# Patient Record
Sex: Female | Born: 1950 | Race: Black or African American | Hispanic: No | Smoking: Never smoker
Health system: Southern US, Community
[De-identification: ages and names within clinical notes are randomized; demographics above are authoritative.]

## PROBLEM LIST (undated history)

## (undated) DIAGNOSIS — I10 Essential (primary) hypertension: Secondary | ICD-10-CM

## (undated) DIAGNOSIS — M549 Dorsalgia, unspecified: Secondary | ICD-10-CM

## (undated) DIAGNOSIS — G8929 Other chronic pain: Secondary | ICD-10-CM

## (undated) DIAGNOSIS — I4510 Unspecified right bundle-branch block: Secondary | ICD-10-CM

## (undated) DIAGNOSIS — F329 Major depressive disorder, single episode, unspecified: Secondary | ICD-10-CM

## (undated) DIAGNOSIS — R5383 Other fatigue: Secondary | ICD-10-CM

## (undated) DIAGNOSIS — K219 Gastro-esophageal reflux disease without esophagitis: Secondary | ICD-10-CM

## (undated) DIAGNOSIS — F32A Depression, unspecified: Secondary | ICD-10-CM

## (undated) DIAGNOSIS — K635 Polyp of colon: Secondary | ICD-10-CM

## (undated) HISTORY — PX: ABDOMINAL HYSTERECTOMY: SHX81

## (undated) HISTORY — PX: BACK SURGERY: SHX140

---

## 2015-01-03 ENCOUNTER — Emergency Department (HOSPITAL_BASED_OUTPATIENT_CLINIC_OR_DEPARTMENT_OTHER)
Admission: EM | Admit: 2015-01-03 | Discharge: 2015-01-03 | Disposition: A | Discharge status: Home / Self Care | Payer: Medicare Other | Attending: Emergency Medicine | Admitting: Emergency Medicine

## 2015-01-03 ENCOUNTER — Encounter (HOSPITAL_BASED_OUTPATIENT_CLINIC_OR_DEPARTMENT_OTHER): Payer: Self-pay | Admitting: *Deleted

## 2015-01-03 ENCOUNTER — Emergency Department (HOSPITAL_BASED_OUTPATIENT_CLINIC_OR_DEPARTMENT_OTHER): Payer: Medicare Other

## 2015-01-03 DIAGNOSIS — Z7951 Long term (current) use of inhaled steroids: Secondary | ICD-10-CM | POA: Diagnosis not present

## 2015-01-03 DIAGNOSIS — I10 Essential (primary) hypertension: Secondary | ICD-10-CM | POA: Diagnosis not present

## 2015-01-03 DIAGNOSIS — R6883 Chills (without fever): Secondary | ICD-10-CM | POA: Diagnosis not present

## 2015-01-03 DIAGNOSIS — Z86010 Personal history of colonic polyps: Secondary | ICD-10-CM | POA: Insufficient documentation

## 2015-01-03 DIAGNOSIS — G8929 Other chronic pain: Secondary | ICD-10-CM | POA: Diagnosis not present

## 2015-01-03 DIAGNOSIS — F329 Major depressive disorder, single episode, unspecified: Secondary | ICD-10-CM | POA: Diagnosis not present

## 2015-01-03 DIAGNOSIS — R079 Chest pain, unspecified: Secondary | ICD-10-CM | POA: Diagnosis present

## 2015-01-03 DIAGNOSIS — Z79899 Other long term (current) drug therapy: Secondary | ICD-10-CM | POA: Diagnosis not present

## 2015-01-03 DIAGNOSIS — R911 Solitary pulmonary nodule: Secondary | ICD-10-CM | POA: Diagnosis not present

## 2015-01-03 DIAGNOSIS — K219 Gastro-esophageal reflux disease without esophagitis: Secondary | ICD-10-CM | POA: Insufficient documentation

## 2015-01-03 DIAGNOSIS — R1012 Left upper quadrant pain: Secondary | ICD-10-CM | POA: Insufficient documentation

## 2015-01-03 HISTORY — DX: Other chronic pain: G89.29

## 2015-01-03 HISTORY — DX: Gastro-esophageal reflux disease without esophagitis: K21.9

## 2015-01-03 HISTORY — DX: Unspecified right bundle-branch block: I45.10

## 2015-01-03 HISTORY — DX: Polyp of colon: K63.5

## 2015-01-03 HISTORY — DX: Other fatigue: R53.83

## 2015-01-03 HISTORY — DX: Major depressive disorder, single episode, unspecified: F32.9

## 2015-01-03 HISTORY — DX: Depression, unspecified: F32.A

## 2015-01-03 HISTORY — DX: Essential (primary) hypertension: I10

## 2015-01-03 HISTORY — DX: Dorsalgia, unspecified: M54.9

## 2015-01-03 LAB — CBC WITH DIFFERENTIAL/PLATELET
BASOS ABS: 0 10*3/uL (ref 0.0–0.1)
Basophils Relative: 0 % (ref 0–1)
Eosinophils Absolute: 0 10*3/uL (ref 0.0–0.7)
Eosinophils Relative: 1 % (ref 0–5)
HEMATOCRIT: 40.5 % (ref 36.0–46.0)
Hemoglobin: 13.3 g/dL (ref 12.0–15.0)
LYMPHS ABS: 1.2 10*3/uL (ref 0.7–4.0)
Lymphocytes Relative: 40 % (ref 12–46)
MCH: 30.2 pg (ref 26.0–34.0)
MCHC: 32.8 g/dL (ref 30.0–36.0)
MCV: 91.8 fL (ref 78.0–100.0)
Monocytes Absolute: 0.3 10*3/uL (ref 0.1–1.0)
Monocytes Relative: 9 % (ref 3–12)
Neutro Abs: 1.4 10*3/uL — ABNORMAL LOW (ref 1.7–7.7)
Neutrophils Relative %: 50 % (ref 43–77)
Platelets: 123 10*3/uL — ABNORMAL LOW (ref 150–400)
RBC: 4.41 MIL/uL (ref 3.87–5.11)
RDW: 12.3 % (ref 11.5–15.5)
WBC: 2.9 10*3/uL — ABNORMAL LOW (ref 4.0–10.5)

## 2015-01-03 LAB — BASIC METABOLIC PANEL
Anion gap: <3 — ABNORMAL LOW (ref 5–15)
BUN: 12 mg/dL (ref 6–23)
CO2: 28 mmol/L (ref 19–32)
CREATININE: 0.68 mg/dL (ref 0.50–1.10)
Calcium: 9.2 mg/dL (ref 8.4–10.5)
Chloride: 108 mmol/L (ref 96–112)
GFR calc Af Amer: >90 mL/min (ref >90)
GFR calc non Af Amer: 90 mL/min — ABNORMAL LOW (ref >90)
Glucose, Bld: 119 mg/dL — ABNORMAL HIGH (ref 70–99)
Potassium: 3.8 mmol/L (ref 3.5–5.1)
SODIUM: 138 mmol/L (ref 135–145)

## 2015-01-03 LAB — TROPONIN I: Troponin I: <0.03 ng/mL (ref <0.031)

## 2015-01-03 LAB — D-DIMER, QUANTITATIVE: D-Dimer, Quant: <0.27 ug/mL-FEU (ref 0.00–0.48)

## 2015-01-03 MED ORDER — MORPHINE SULFATE 4 MG/ML IJ SOLN
4.0000 mg | Freq: Once | INTRAMUSCULAR | Status: AC
  Administered 2015-01-03: 4 mg via INTRAVENOUS
  Filled 2015-01-03: qty 1

## 2015-01-03 NOTE — ED Provider Notes (Signed)
CSN: [id number]     Arrival date & time 01/03/15  1533 History  This chart was scribed for Tracy SkeensJoshua M Tyrees Chopin, MD by [place] Peace, ED Scribe. The patient was seen in [facility]. The patient's care was started at 4:19 PM.     Chief Complaint  Patient presents with  . Chest Pain      Patient is a 64 y.o. female presenting with chest pain. The history is provided by the patient. No language interpreter was used.  Chest Pain Associated symptoms: no cough, no fever, no nausea, no shortness of breath and not vomiting     HPI Comments: Tracy Nixon is a 64 y.o. female who presents to the Emergency Department complaining of constant radiating left flank pain onset 3 weeks ago that extends around to her scapula. Pt reports pain is extremely severe. Pt also reports weight lost of about 30 lbs since August 2015. She denies any mechanisms of injury. No complaints of fever, vomiting, diarrhea, neck pain, SOB, cough. History of hypertension.  She denies history of cancer, blood clots, or family members who suffered heart attacks under the age of 66.   Past Medical History  Diagnosis Date  . Hypertension   . Depressed   . Chronic back pain   . Gastroesophageal reflux   . Right bundle branch block   . Colon polyp   . Fatigue    Past Surgical History  Procedure Laterality Date  . Back surgery    . Abdominal hysterectomy     No family history on file. History  Substance Use Topics  . Smoking status: Never Smoker   . Smokeless tobacco: Not on file  . Alcohol Use: No   OB History    No data available     Review of Systems  Constitutional: Positive for chills. Negative for fever.  Respiratory: Negative for cough and shortness of breath.   Cardiovascular: Positive for chest pain.  Gastrointestinal: Negative for nausea, vomiting and diarrhea.  Musculoskeletal: Negative for neck pain.       Left flank pain.       Allergies  Review of patient's allergies indicates no known allergies.  Home  Medications   Prior to Admission medications   Medication Sig Start Date End Date Taking? Authorizing Provider  azelastine (ASTELIN) 0.1 % nasal spray Place 1 spray into both nostrils 2 (two) times daily. Use in each nostril as directed   Yes Historical Provider, MD  diclofenac sodium (VOLTAREN) 1 % GEL Apply topically 4 (four) times daily.   Yes Historical Provider, MD  DULoxetine (CYMBALTA) 30 MG capsule Take 30 mg by mouth daily.   Yes Historical Provider, MD  enalapril (VASOTEC) 10 MG tablet Take 10 mg by mouth daily.   Yes Historical Provider, MD  esomeprazole (NEXIUM) 40 MG capsule Take 40 mg by mouth daily at 12 noon.   Yes Historical Provider, MD  ezetimibe-simvastatin (VYTORIN) 10-10 MG per tablet Take 1 tablet by mouth at bedtime.   Yes Historical Provider, MD  fluticasone (FLOVENT HFA) 220 MCG/ACT inhaler Inhale into the lungs 2 (two) times daily.   Yes Historical Provider, MD  furosemide (LASIX) 40 MG tablet Take 40 mg by mouth.   Yes Historical Provider, MD  HYDROmorphone HCl (EXALGO) 12 MG T24A SR tablet Take 12 mg by mouth daily.   Yes Historical Provider, MD  ondansetron (ZOFRAN) 4 MG tablet Take 4 mg by mouth every 8 (eight) hours as needed for nausea or vomiting.   Yes Historical  Provider, MD  oxyCODONE-acetaminophen (PERCOCET) 10-325 MG per tablet Take 1 tablet by mouth every 4 (four) hours as needed for pain.   Yes Historical Provider, MD  tiZANidine (ZANAFLEX) 4 MG capsule Take 4 mg by mouth 3 (three) times daily.   Yes Historical Provider, MD  traZODone (DESYREL) 50 MG tablet Take 50 mg by mouth at bedtime.   Yes Historical Provider, MD  Vitamin D, Ergocalciferol, (DRISDOL) 50000 UNITS CAPS capsule Take 50,000 Units by mouth every 7 (seven) days.   Yes Historical Provider, MD  zolpidem (AMBIEN) 5 MG tablet Take 5 mg by mouth at bedtime as needed for sleep.   Yes Historical Provider, MD   BP 171/72 mmHg  Pulse 67  Temp(Src) 98.9 F (37.2 C) (Oral)  Resp 18  Ht   (1.651 m)  Wt 133 lb (60.328 kg)  BMI 22.13 kg/m2  SpO2 98% Physical Exam  Constitutional: She is oriented to person, place, and time. She appears well-developed and well-nourished. No distress.  HENT:  Head: Normocephalic and atraumatic.  Eyes: Conjunctivae and EOM are normal.  Neck: Neck supple. No tracheal deviation present.  Cardiovascular: Normal rate and regular rhythm.   Pulmonary/Chest: Effort normal and breath sounds normal. No respiratory distress.  Abdominal: Soft. There is no tenderness.  Musculoskeletal: Normal range of motion. She exhibits tenderness.  Tenderness to left upper flank, inferior to axilla.   Lymphadenopathy:    She has no cervical adenopathy.  Neurological: She is alert and oriented to person, place, and time.  Skin: Skin is warm and dry.  Psychiatric: She has a normal mood and affect. Her behavior is normal.  Nursing note and vitals reviewed.   ED Course  Procedures (including critical care time) Labs Review Labs Reviewed  BASIC METABOLIC PANEL - Abnormal; Notable for the following:    Glucose, Bld 119 (*)    GFR calc non Af Amer 90 (*)    Anion gap <3 (*)    All other components within normal limits  CBC WITH DIFFERENTIAL/PLATELET - Abnormal; Notable for the following:    WBC 2.9 (*)    Platelets 123 (*)    Neutro Abs 1.4 (*)    All other components within normal limits  TROPONIN I  D-DIMER, QUANTITATIVE    Imaging Review Dg Chest 2 View  01/03/2015   CLINICAL DATA:  Left-sided chest pain for 3 weeks.  EXAM: CHEST  2 VIEW  COMPARISON:  None.  FINDINGS: The heart is upper limits of normal in size. There is mild tortuosity of the thoracic aorta. The lungs are clear. No pleural effusion. Bilateral nipple shadows are noted. The bony thorax is intact.  IMPRESSION: No acute cardiopulmonary findings.   Electronically Signed   By: Loralie Champagne M.D.   On: 01/03/2015 17:16   Ct Chest Wo Contrast  01/03/2015   CLINICAL DATA:  Left-sided chest pain for  3 weeks. 30 lb weight loss since August 2015.  EXAM: CT CHEST WITHOUT CONTRAST  TECHNIQUE: Multidetector CT imaging of the chest was performed following the standard protocol without IV contrast.  FINDINGS: Chest wall: No breast masses, supraclavicular or axillary lymphadenopathy. The thyroid gland is grossly normal. The bony thorax is intact. No destructive bone lesions or spinal canal compromise.  Mediastinum: The heart is normal in size. No pericardial effusion. No mediastinal or hilar mass or adenopathy. The esophagus is grossly normal. The aorta is normal in caliber. Scattered atherosclerotic calcifications. Coronary artery calcifications are noted.  Lungs/pleura: No acute pulmonary  findings. No worrisome pulmonary lesions. There are a few scattered sub 4 mm pulmonary nodules which are likely benign. No infiltrates or effusions. No interstitial lung disease or bronchiectasis.  Upper abdomen: No significant findings. Aortic calcifications are noted.  IMPRESSION: 1. No acute pulmonary findings or worrisome pulmonary lesions. 2. A few small scattered sub 4 mm pulmonary nodules are likely benign lymph nodes. If the patient is at high risk for bronchogenic carcinoma, follow-up chest CT at 1 year is recommended. If the patient is at low risk, no follow-up is needed. This recommendation follows the consensus statement: Guidelines for Management of Small Pulmonary Nodules Detected on CT Scans: A Statement from the Fleischner Society as published in Radiology 2005; 237:395-400. 3. No mediastinal or hilar mass or adenopathy. 4. Aortic and coronary artery calcifications.   Electronically Signed   By: Loralie Champagne M.D.   On: 01/03/2015 18:37     EKG Interpretation   Date/Time:  Saturday January 03 2015 15:48:38 EST Ventricular Rate:  62 PR Interval:  176 QRS Duration: 94 QT Interval:  402 QTC Calculation: 408 R Axis:   13 Text Interpretation:  Normal sinus rhythm No old ekg Confirmed by Amie Cowens   MD,  [place] Carmack ([id number]) on 01/03/2015 4:24:01 PM     Medications  morphine 4 MG/ML injection 4 mg (4 mg Intravenous Given 01/03/15 1640)  morphine 4 MG/ML injection 4 mg (4 mg Intravenous Given 01/03/15 1810)   EKG reviewed heart rate 62 no acute ST elevation, normal QT 4:26 PM- Treatment plan was discussed with patient who verbalizes understanding and agrees.   MDM   Final diagnoses:  Chest pain  Pulmonary nodule   I personally performed the services described in this documentation, which was scribed in my presence. The recorded information has been reviewed and is accurate.  Patient presents with 3 weeks of persistent left upper flank pain, no injury, no cardiac history. Pain reproducible on palpation. Pain improved in ER. Patient low risk for blood clot and atypical for cardiac. Troponin negative and patient had persistent pain for many days now.  CT chest without contrast ordered no acute finding small pulmonary nodule patient can follow-up outpatient for. Patient low risk.  Results and differential diagnosis were discussed with the patient/parent/guardian. Close follow up outpatient was discussed, comfortable with the plan.   Medications  morphine 4 MG/ML injection 4 mg (4 mg Intravenous Given 01/03/15 1640)  morphine 4 MG/ML injection 4 mg (4 mg Intravenous Given 01/03/15 1810)    Filed Vitals:   01/03/15 1548 01/03/15 1808  BP: 171/72 163/65  Pulse: 67 57  Temp: 98.9 F (37.2 C)   TempSrc: Oral   Resp: 18 18  Height:  (1.651 m)   Weight: 133 lb (60.328 kg)   SpO2: 98% 97%    Final diagnoses:  Chest pain  Pulmonary nodule      Tracy Skeens, MD 01/03/15 1932

## 2015-01-03 NOTE — Discharge Instructions (Signed)
If you were given medicines take as directed.  If you are on coumadin or contraceptives realize their levels and effectiveness is altered by many different medicines.  If you have any reaction (rash, tongues swelling, other) to the medicines stop taking and see a physician.   Please follow up as directed and return to the ER or see a physician for new or worsening symptoms.  Thank you. Filed Vitals:   01/03/15 1548 01/03/15 1808  BP: 171/72 163/65  Pulse: 67 57  Temp: 98.9 F (37.2 C)   TempSrc: Oral   Resp: 18 18  Height: 5\' 5"  (1.651 m)   Weight: 133 lb (60.328 kg)   SpO2: 98% 97%   Chest Pain (Nonspecific) It is often hard to give a specific diagnosis for the cause of chest pain. There is always a chance that your pain could be related to something serious, such as a heart attack or a blood clot in the lungs. You need to follow up with your health care provider for further evaluation. CAUSES   Heartburn.  Pneumonia or bronchitis.  Anxiety or stress.  Inflammation around your heart (pericarditis) or lung (pleuritis or pleurisy).  A blood clot in the lung.  A collapsed lung (pneumothorax). It can develop suddenly on its own (spontaneous pneumothorax) or from trauma to the chest.  Shingles infection (herpes zoster virus). The chest wall is composed of bones, muscles, and cartilage. Any of these can be the source of the pain.  The bones can be bruised by injury.  The muscles or cartilage can be strained by coughing or overwork.  The cartilage can be affected by inflammation and become sore (costochondritis). DIAGNOSIS  Lab tests or other studies may be needed to find the cause of your pain. Your health care provider may have you take a test called an ambulatory electrocardiogram (ECG). An ECG records your heartbeat patterns over a 24-hour period. You may also have other tests, such as:  Transthoracic echocardiogram (TTE). During echocardiography, sound waves are used to  evaluate how blood flows through your heart.  Transesophageal echocardiogram (TEE).  Cardiac monitoring. This allows your health care provider to monitor your heart rate and rhythm in real time.  Holter monitor. This is a portable device that records your heartbeat and can help diagnose heart arrhythmias. It allows your health care provider to track your heart activity for several days, if needed.  Stress tests by exercise or by giving medicine that makes the heart beat faster. TREATMENT   Treatment depends on what may be causing your chest pain. Treatment may include:  Acid blockers for heartburn.  Anti-inflammatory medicine.  Pain medicine for inflammatory conditions.  Antibiotics if an infection is present.  You may be advised to change lifestyle habits. This includes stopping smoking and avoiding alcohol, caffeine, and chocolate.  You may be advised to keep your head raised (elevated) when sleeping. This reduces the chance of acid going backward from your stomach into your esophagus. Most of the time, nonspecific chest pain will improve within 2-3 days with rest and mild pain medicine.  HOME CARE INSTRUCTIONS   If antibiotics were prescribed, take them as directed. Finish them even if you start to feel better.  For the next few days, avoid physical activities that bring on chest pain. Continue physical activities as directed.  Do not use any tobacco products, including cigarettes, chewing tobacco, or electronic cigarettes.  Avoid drinking alcohol.  Only take medicine as directed by your health care provider.  Follow your health care provider's suggestions for further testing if your chest pain does not go away.  Keep any follow-up appointments you made. If you do not go to an appointment, you could develop lasting (chronic) problems with pain. If there is any problem keeping an appointment, call to reschedule. SEEK MEDICAL CARE IF:   Your chest pain does not go away,  even after treatment.  You have a rash with blisters on your chest.  You have a fever. SEEK IMMEDIATE MEDICAL CARE IF:   You have increased chest pain or pain that spreads to your arm, neck, jaw, back, or abdomen.  You have shortness of breath.  You have an increasing cough, or you cough up blood.  You have severe back or abdominal pain.  You feel nauseous or vomit.  You have severe weakness.  You faint.  You have chills. This is an emergency. Do not wait to see if the pain will go away. Get medical help at once. Call your local emergency services (911 in [place] Do not drive yourself to the hospital. MAKE SURE YOU:   Understand these instructions.  Will watch your condition.  Will get help right away if you are not doing well or get worse. Document Released: 08/24/2005 Document Revised: 11/19/2013 Document Reviewed: 06/19/2008 [facility] Patient Information 2015 [place], [place]. This information is not intended to replace advice given to you by your health care provider. Make sure you discuss any questions you have with your health care provider.

## 2015-01-03 NOTE — ED Notes (Signed)
Three weeks ago patient began having pain in her upper left side radiating to her back. Denies injury.

## 2016-02-05 IMAGING — CR DG CHEST 2V
2 series · 2 of 2 positions shown · non-contrast
Comparison: None.

CLINICAL DATA: Left-sided chest pain for 3 weeks.

EXAM:
CHEST  2 VIEW

[w chest pa]
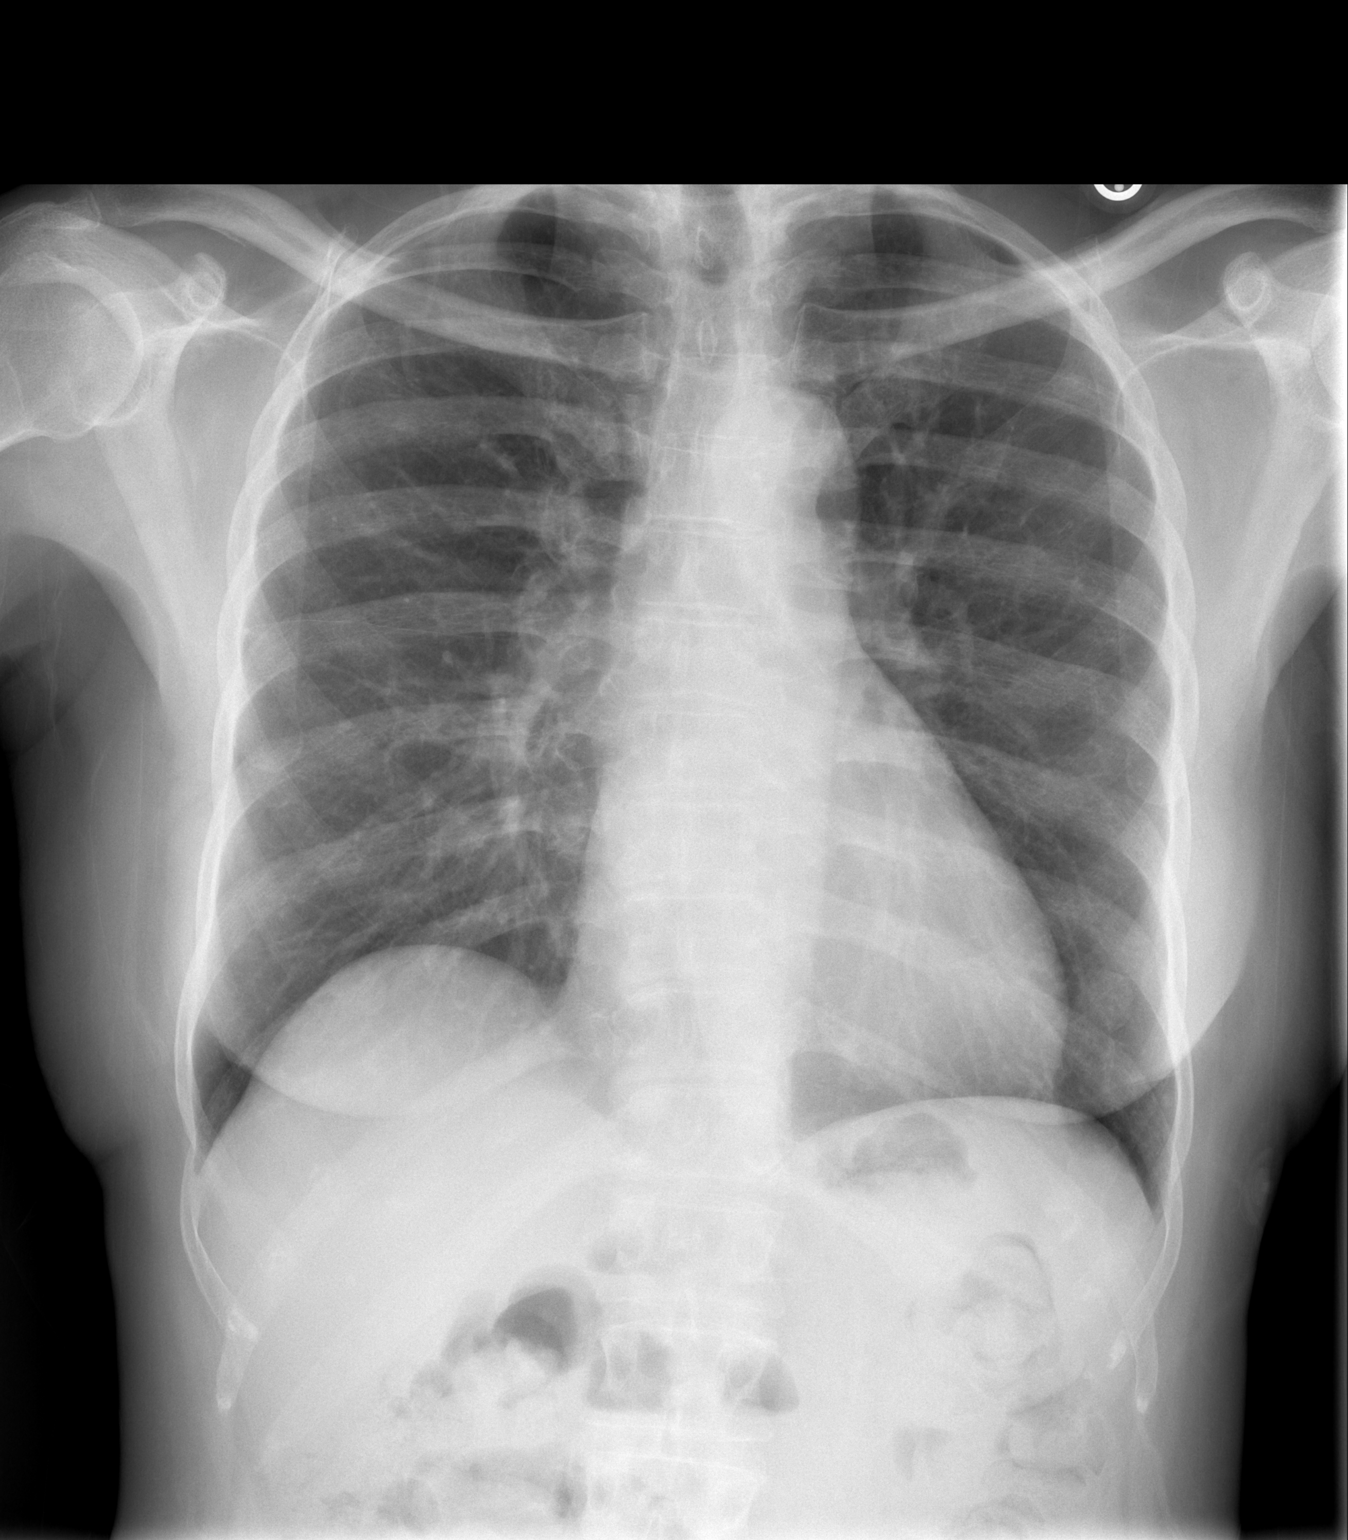

[w chest lat]
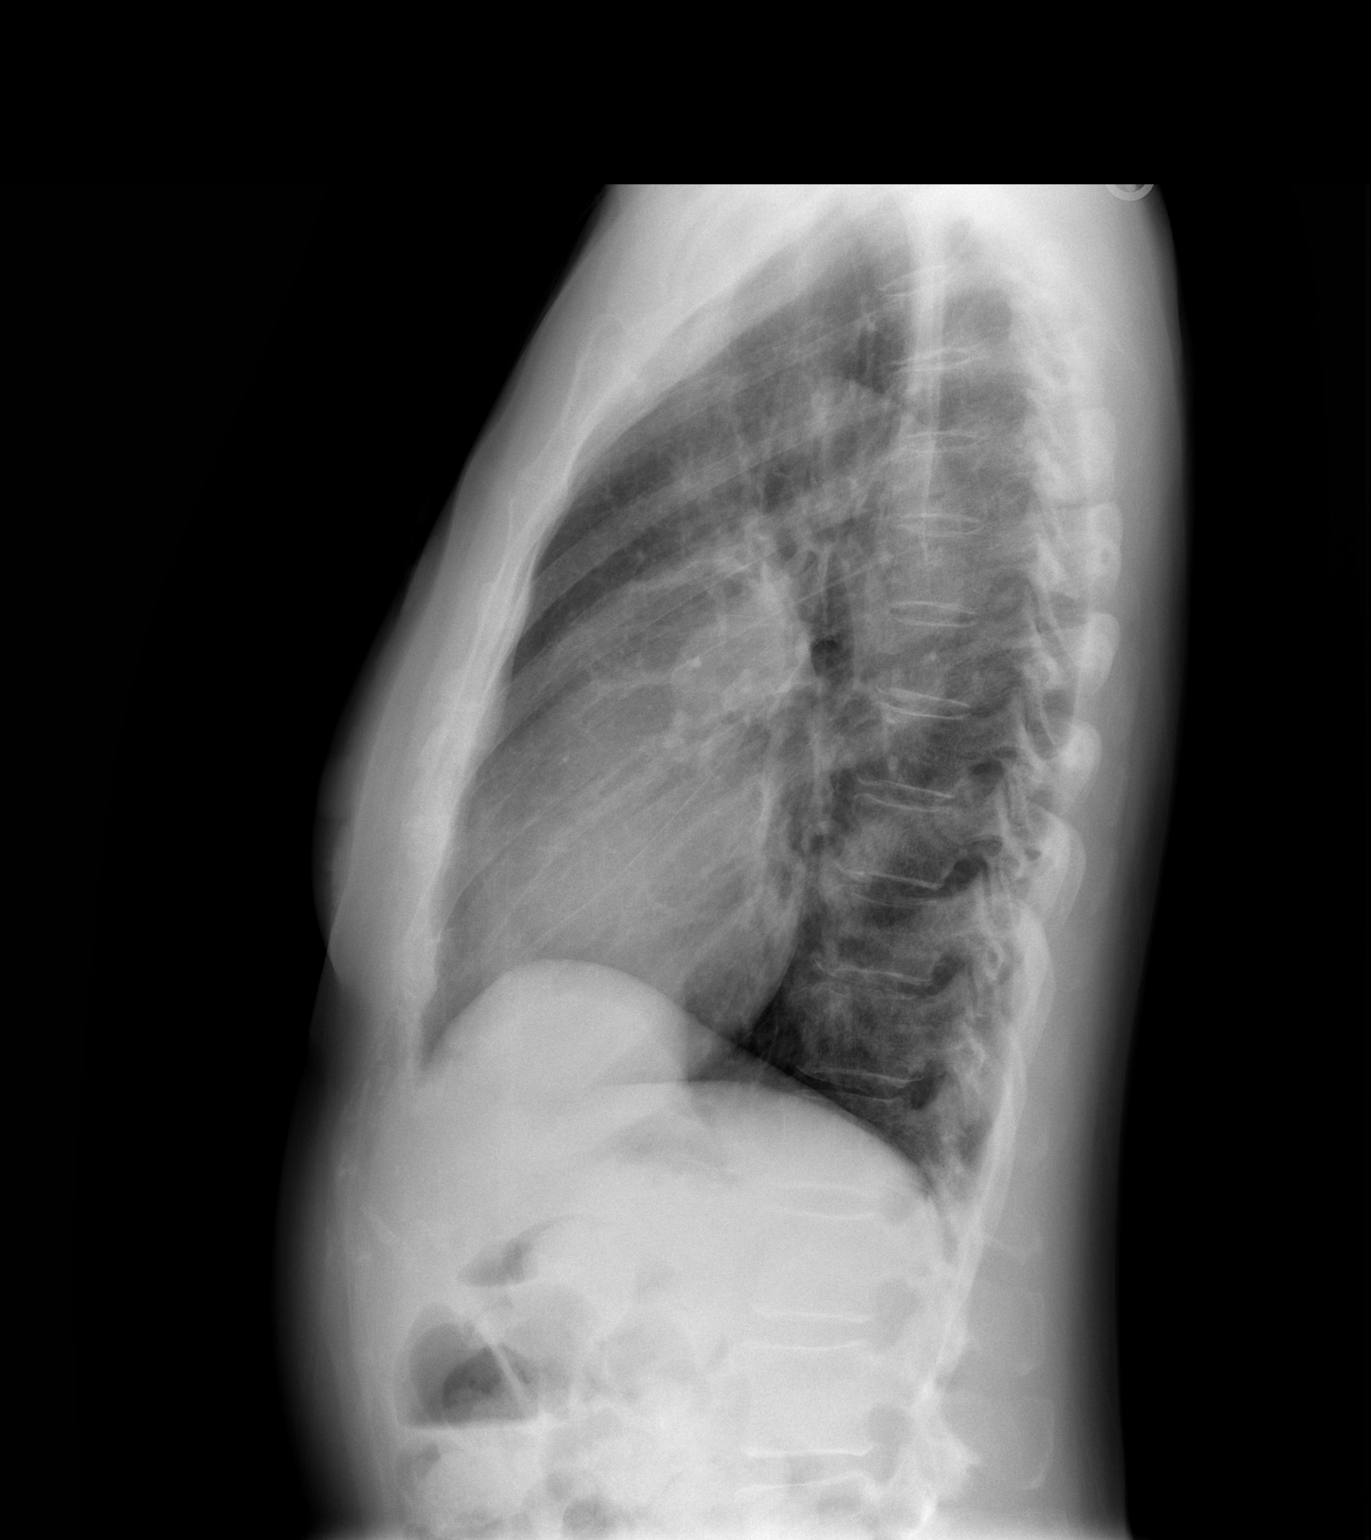

[2 of 2 positions shown; findings below may reference images not displayed]

FINDINGS: The heart is upper limits of normal in size. There is mild
tortuosity of the thoracic aorta. The lungs are clear. No pleural
effusion. Bilateral nipple shadows are noted. The bony thorax is
intact.
IMPRESSION: No acute cardiopulmonary findings.

## 2016-02-05 IMAGING — CT CT CHEST W/O CM
2 of 3 series · 15 of 36 positions shown, 18 images · non-contrast
Comparison: none

CLINICAL DATA: Left-sided chest pain for 3 weeks. 30 lb weight loss
since June 2014.

EXAM:
CT CHEST WITHOUT CONTRAST
TECHNIQUE: Multidetector CT imaging of the chest was performed following the
standard protocol without IV contrast..

[Series 2: chest 5.0 b31f · axial · 0.70mm/px · z∈[-280,-10]mm · 12 of 64 slices shown, 15 images]
[im 5/64  mediastinal]
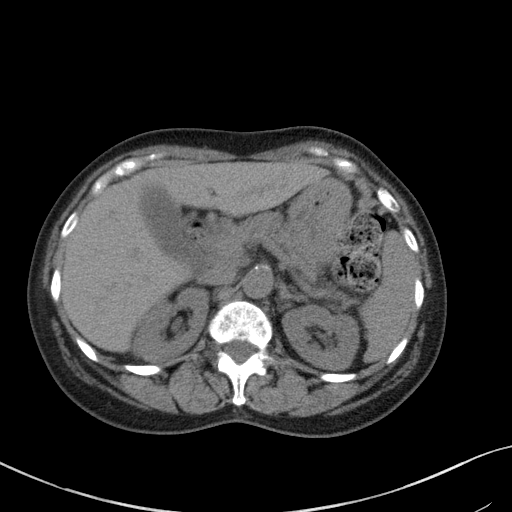
[im 5/64  lung]
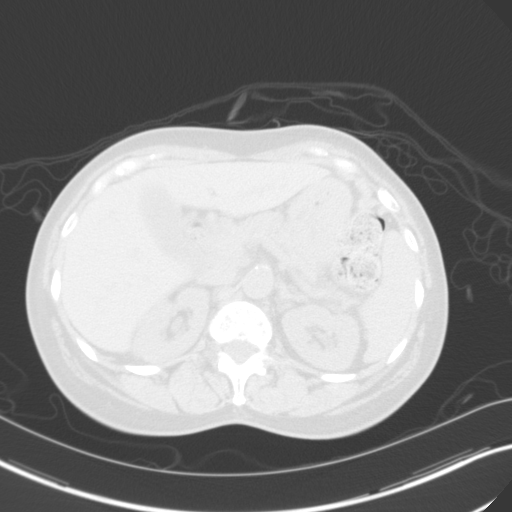
[im 10/64  lung]
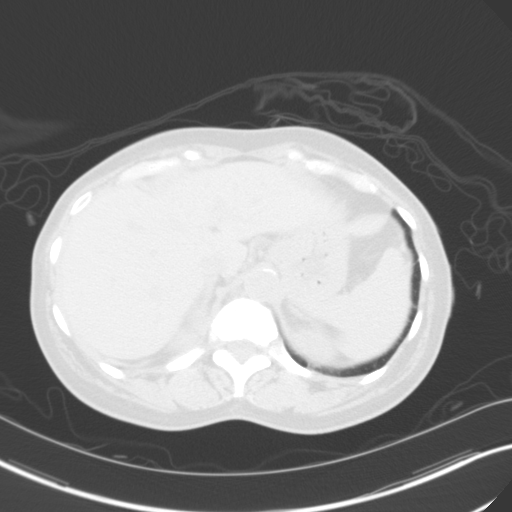
[im 15/64  lung]
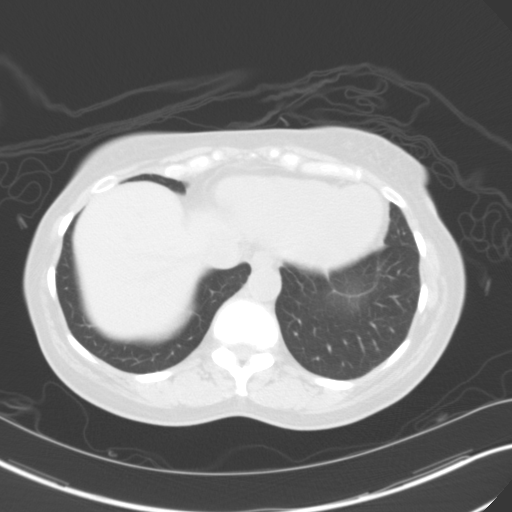
[im 19/64  lung]
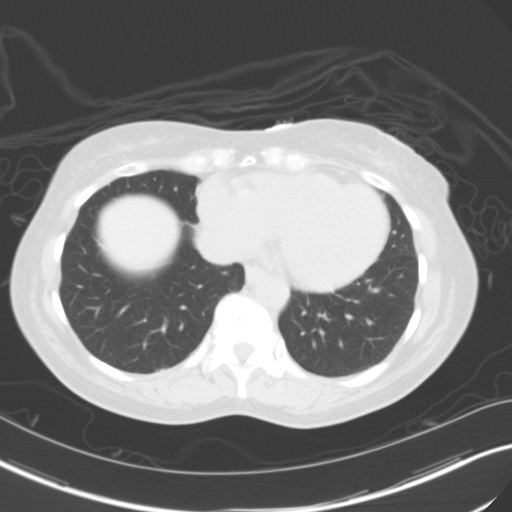
[im 24/64  mediastinal]
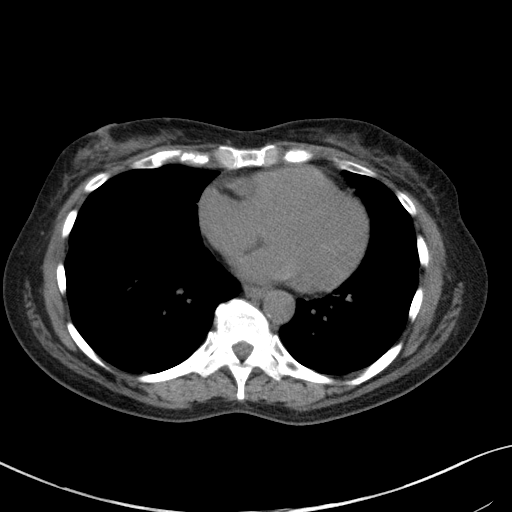
[im 24/64  lung]
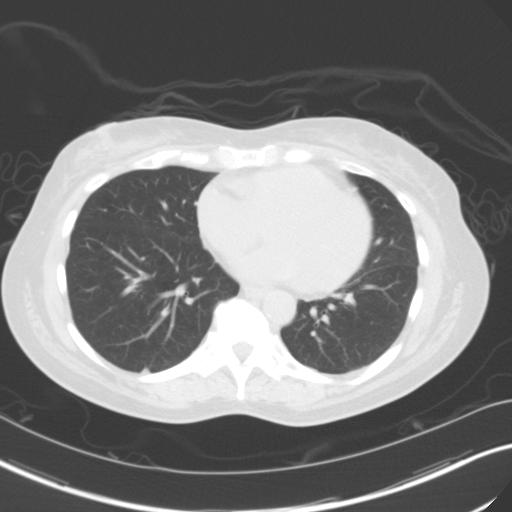
[im 29/64  lung]
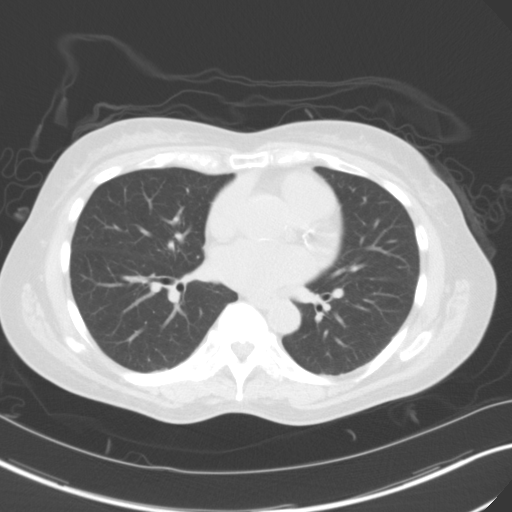
[im 36/64  lung]
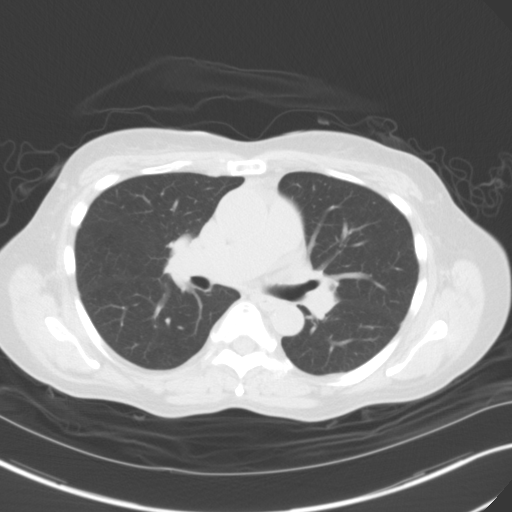
[im 40/64  lung]
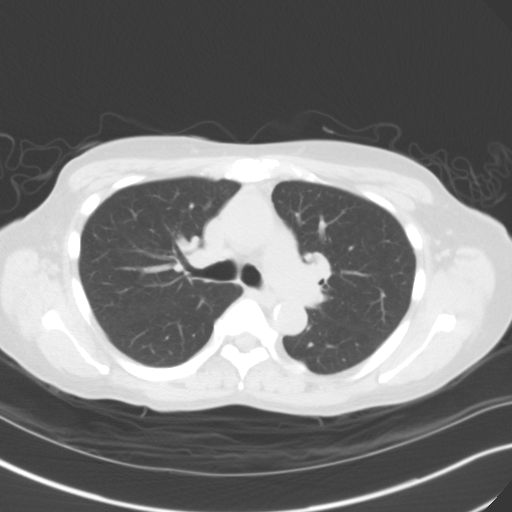
[im 45/64  mediastinal]
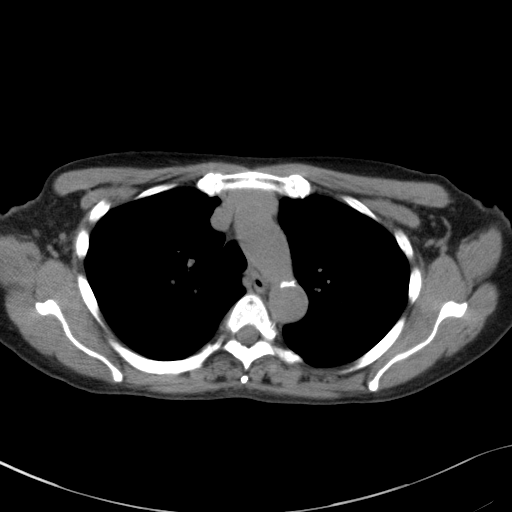
[im 45/64  lung]
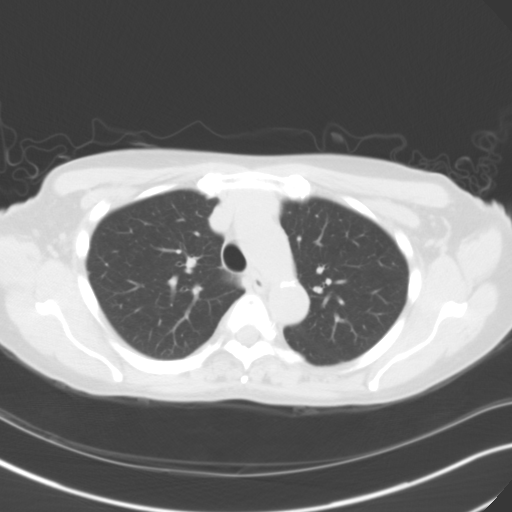
[im 50/64  lung]
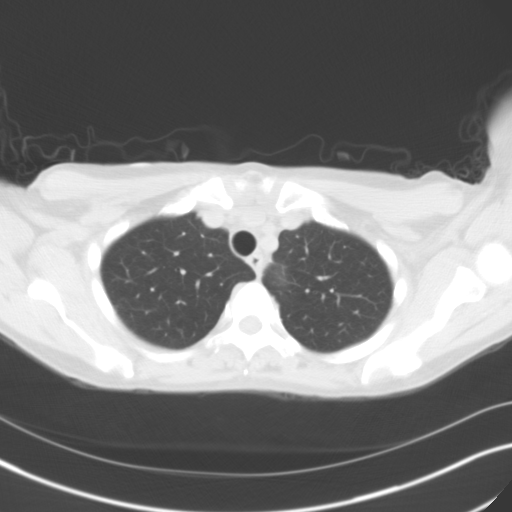
[im 54/64  lung]
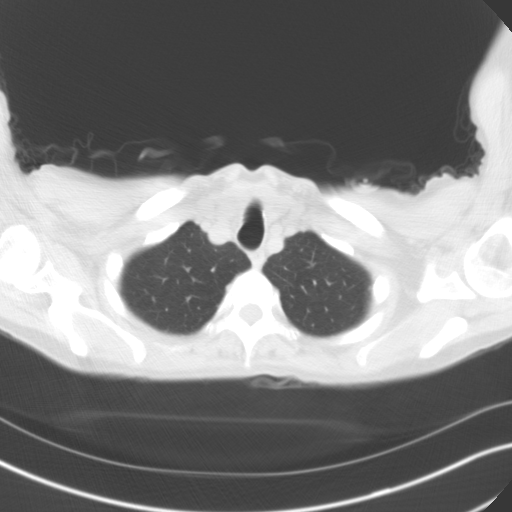
[im 59/64  lung]
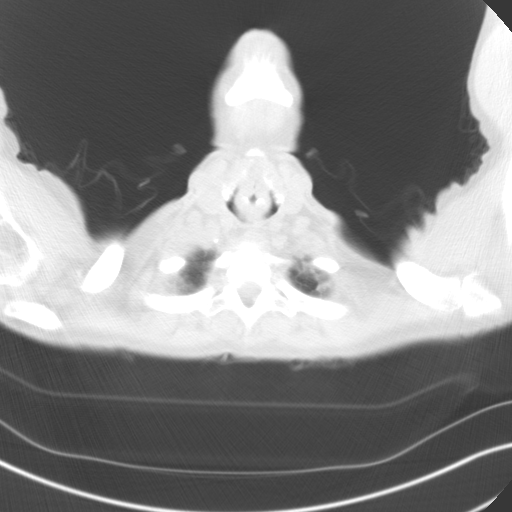

[Series 6: chest 3.0 coronal · coronal · 0.67mm/px · 3 of 71 slices shown]
[im 15/71  lung]
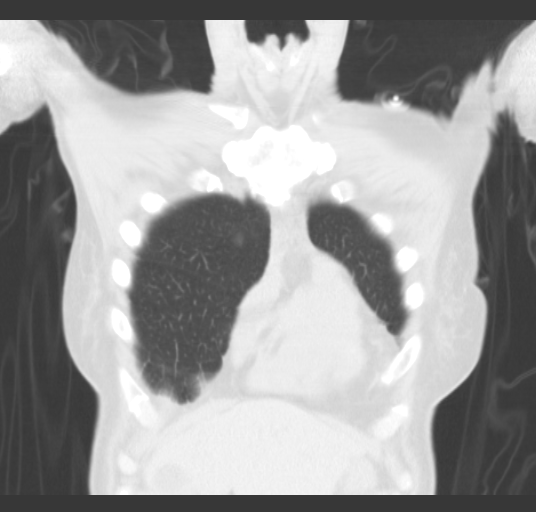
[im 29/71  lung]
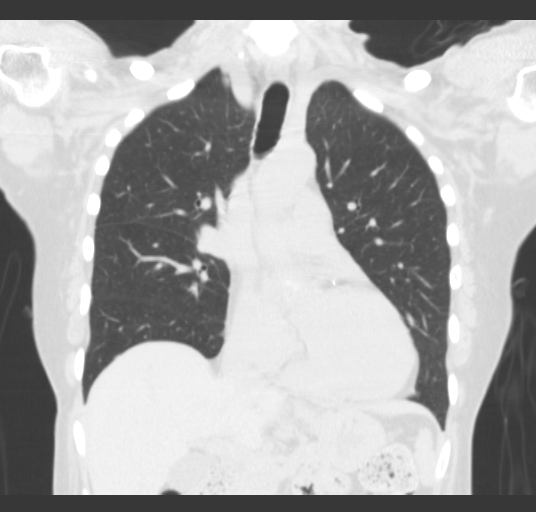
[im 43/71  lung]
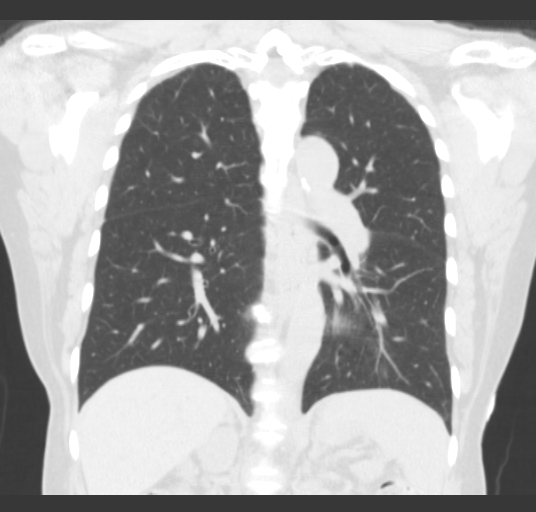

[15 of 36 positions shown; findings below may reference images not displayed]

FINDINGS: Chest wall: No breast masses, supraclavicular or axillary
lymphadenopathy. The thyroid gland is grossly normal. The bony
thorax is intact. No destructive bone lesions or spinal canal
compromise.

Mediastinum: The heart is normal in size. No pericardial effusion.
No mediastinal or hilar mass or adenopathy. The esophagus is grossly
normal. The aorta is normal in caliber. Scattered atherosclerotic
calcifications. Coronary artery calcifications are noted.

Lungs/pleura: No acute pulmonary findings. No worrisome pulmonary
lesions. There are a few scattered sub 4 mm pulmonary nodules which
are likely benign. No infiltrates or effusions. No interstitial lung
disease or bronchiectasis.

Upper abdomen: No significant findings. Aortic calcifications are
noted.
IMPRESSION: 1. No acute pulmonary findings or worrisome pulmonary lesions.
2. A few small scattered sub 4 mm pulmonary nodules are likely
benign lymph nodes. If the patient is at high risk for bronchogenic
carcinoma, follow-up chest CT at 1 year is recommended. If the
patient is at low risk, no follow-up is needed. This recommendation
follows the consensus statement: Guidelines for Management of Small
Pulmonary Nodules Detected on CT Scans: A Statement from the
3. No mediastinal or hilar mass or adenopathy.
4. Aortic and coronary artery calcifications.

## 2017-12-03 ENCOUNTER — Encounter (HOSPITAL_BASED_OUTPATIENT_CLINIC_OR_DEPARTMENT_OTHER): Payer: Self-pay | Admitting: Emergency Medicine

## 2017-12-03 ENCOUNTER — Other Ambulatory Visit: Payer: Self-pay

## 2017-12-03 ENCOUNTER — Emergency Department (HOSPITAL_BASED_OUTPATIENT_CLINIC_OR_DEPARTMENT_OTHER): Payer: Medicare HMO

## 2017-12-03 ENCOUNTER — Emergency Department (HOSPITAL_BASED_OUTPATIENT_CLINIC_OR_DEPARTMENT_OTHER)
Admission: EM | Admit: 2017-12-03 | Discharge: 2017-12-03 | Disposition: A | Discharge status: Home / Self Care | Payer: Medicare HMO | Attending: Emergency Medicine | Admitting: Emergency Medicine

## 2017-12-03 DIAGNOSIS — R51 Headache: Secondary | ICD-10-CM | POA: Diagnosis present

## 2017-12-03 DIAGNOSIS — Z79899 Other long term (current) drug therapy: Secondary | ICD-10-CM | POA: Diagnosis not present

## 2017-12-03 DIAGNOSIS — I10 Essential (primary) hypertension: Secondary | ICD-10-CM | POA: Diagnosis not present

## 2017-12-03 DIAGNOSIS — R519 Headache, unspecified: Secondary | ICD-10-CM

## 2017-12-03 LAB — CBC WITH DIFFERENTIAL/PLATELET
BASOS ABS: 0 10*3/uL (ref 0.0–0.1)
Basophils Relative: 0 %
EOS ABS: 0 10*3/uL (ref 0.0–0.7)
Eosinophils Relative: 0 %
HCT: 35.4 % — ABNORMAL LOW (ref 36.0–46.0)
HEMOGLOBIN: 11.7 g/dL — AB (ref 12.0–15.0)
Lymphocytes Relative: 38 %
Lymphs Abs: 1.3 10*3/uL (ref 0.7–4.0)
MCH: 30.8 pg (ref 26.0–34.0)
MCHC: 33.1 g/dL (ref 30.0–36.0)
MCV: 93.2 fL (ref 78.0–100.0)
MONOS PCT: 11 %
Monocytes Absolute: 0.4 10*3/uL (ref 0.1–1.0)
NEUTROS PCT: 51 %
Neutro Abs: 1.7 10*3/uL (ref 1.7–7.7)
Platelets: 122 10*3/uL — ABNORMAL LOW (ref 150–400)
RBC: 3.8 MIL/uL — ABNORMAL LOW (ref 3.87–5.11)
RDW: 12.5 % (ref 11.5–15.5)
WBC: 3.4 10*3/uL — AB (ref 4.0–10.5)

## 2017-12-03 LAB — BASIC METABOLIC PANEL
Anion gap: 4 — ABNORMAL LOW (ref 5–15)
BUN: 9 mg/dL (ref 6–20)
CALCIUM: 8.7 mg/dL — AB (ref 8.9–10.3)
CO2: 22 mmol/L (ref 22–32)
Chloride: 112 mmol/L — ABNORMAL HIGH (ref 101–111)
Creatinine, Ser: 0.72 mg/dL (ref 0.44–1.00)
Glucose, Bld: 100 mg/dL — ABNORMAL HIGH (ref 65–99)
Potassium: 3.3 mmol/L — ABNORMAL LOW (ref 3.5–5.1)
SODIUM: 138 mmol/L (ref 135–145)

## 2017-12-03 MED ORDER — SODIUM CHLORIDE 0.9 % IV BOLUS (SEPSIS)
500.0000 mL | Freq: Once | INTRAVENOUS | Status: AC
  Administered 2017-12-03: 500 mL via INTRAVENOUS

## 2017-12-03 MED ORDER — MAGNESIUM SULFATE IN D5W 1-5 GM/100ML-% IV SOLN
1.0000 g | Freq: Once | INTRAVENOUS | Status: AC
  Administered 2017-12-03: 1 g via INTRAVENOUS
  Filled 2017-12-03: qty 100

## 2017-12-03 MED ORDER — VALPROATE SODIUM 500 MG/5ML IV SOLN
INTRAVENOUS | Status: AC
  Administered 2017-12-03: 22:00:00
  Filled 2017-12-03: qty 5

## 2017-12-03 MED ORDER — PROCHLORPERAZINE EDISYLATE 5 MG/ML IJ SOLN
10.0000 mg | Freq: Once | INTRAMUSCULAR | Status: AC
  Administered 2017-12-03: 10 mg via INTRAVENOUS
  Filled 2017-12-03: qty 2

## 2017-12-03 MED ORDER — DEXAMETHASONE 6 MG PO TABS
10.0000 mg | ORAL_TABLET | Freq: Once | ORAL | Status: AC
  Administered 2017-12-03: 10 mg via ORAL
  Filled 2017-12-03: qty 1

## 2017-12-03 MED ORDER — DIPHENHYDRAMINE HCL 25 MG PO CAPS
25.0000 mg | ORAL_CAPSULE | Freq: Once | ORAL | Status: AC
  Administered 2017-12-03: 25 mg via ORAL
  Filled 2017-12-03: qty 1

## 2017-12-03 MED ORDER — VALPROATE SODIUM 500 MG/5ML IV SOLN
500.0000 mg | Freq: Once | INTRAVENOUS | Status: AC
  Administered 2017-12-03: 500 mg via INTRAVENOUS
  Filled 2017-12-03: qty 5

## 2017-12-03 MED ORDER — KETOROLAC TROMETHAMINE 15 MG/ML IJ SOLN
15.0000 mg | Freq: Once | INTRAMUSCULAR | Status: AC
  Administered 2017-12-03: 15 mg via INTRAVENOUS
  Filled 2017-12-03: qty 1

## 2017-12-03 MED ORDER — METOCLOPRAMIDE HCL 5 MG/ML IJ SOLN
10.0000 mg | Freq: Once | INTRAMUSCULAR | Status: AC
  Administered 2017-12-03: 10 mg via INTRAVENOUS
  Filled 2017-12-03: qty 2

## 2017-12-03 NOTE — ED Notes (Signed)
Pt up to bathroom. Steady gait. NAD.

## 2017-12-03 NOTE — ED Triage Notes (Signed)
Pt reports headache since last night with nausea and photophobia.

## 2017-12-03 NOTE — ED Notes (Signed)
Pt sleeping, RN woke patient to assess pain. Pt states 4/10, and that she is ready to go home. [facility] infusion nearly complete. EDP notified. Son called for pick up.

## 2017-12-03 NOTE — ED Provider Notes (Signed)
[facility] EMERGENCY DEPARTMENT Provider Note   CSN: [id number] Arrival date & time: 12/03/17  1409     History   Chief Complaint Chief Complaint  Patient presents with  . Headache    HPI Tracy Nixon is a 67 y.o. female.  The history is provided by the patient. No language interpreter was used.  Headache      Tracy Nixon is a 67 y.o. female who presents to the Emergency Department complaining of headache.  She reports global headache that began yesterday evening.  Headache is gradual in onset with associated nausea and photophobia.  Her headache does go down her neck into her shoulders bilaterally.  She has a history of migraine headaches but it has been years since she has had one.  She is on preventative Topamax twice daily.  This headache is typical for her migraines.  She denies any visual changes, numbness, weakness, fevers, chest pain.  She endorses poor sleep secondary to headache.  Symptoms are severe, constant, worsening.  Past Medical History:  Diagnosis Date  . Chronic back pain   . Colon polyp   . Depressed   . Fatigue   . Gastroesophageal reflux   . Hypertension   . Right bundle branch block     There are no active problems to display for this patient.   Past Surgical History:  Procedure Laterality Date  . ABDOMINAL HYSTERECTOMY    . BACK SURGERY      OB History    No data available       Home Medications    Prior to Admission medications   Medication Sig Start Date End Date Taking? Authorizing Provider  azelastine (ASTELIN) 0.1 % nasal spray Place 1 spray into both nostrils 2 (two) times daily. Use in each nostril as directed    [provider]  diclofenac sodium (VOLTAREN) 1 % GEL Apply topically 4 (four) times daily.    [provider]  DULoxetine (CYMBALTA) 30 MG capsule Take 30 mg by mouth daily.    [provider]  enalapril (VASOTEC) 10 MG tablet Take 10 mg by mouth daily.    [provider]    esomeprazole (NEXIUM) 40 MG capsule Take 40 mg by mouth daily at 12 noon.    [provider]  ezetimibe-simvastatin (VYTORIN) 10-10 MG per tablet Take 1 tablet by mouth at bedtime.    [provider]  fluticasone (FLOVENT HFA) 220 MCG/ACT inhaler Inhale into the lungs 2 (two) times daily.    [provider]  furosemide (LASIX) 40 MG tablet Take 40 mg by mouth.    [provider]  HYDROmorphone HCl (EXALGO) 12 MG T24A SR tablet Take 12 mg by mouth daily.    [provider]  ondansetron (ZOFRAN) 4 MG tablet Take 4 mg by mouth every 8 (eight) hours as needed for nausea or vomiting.    [provider]  oxyCODONE-acetaminophen (PERCOCET) 10-325 MG per tablet Take 1 tablet by mouth every 4 (four) hours as needed for pain.    [provider]  tiZANidine (ZANAFLEX) 4 MG capsule Take 4 mg by mouth 3 (three) times daily.    [provider]  traZODone (DESYREL) 50 MG tablet Take 50 mg by mouth at bedtime.    [provider]  Vitamin D, Ergocalciferol, (DRISDOL) 50000 UNITS CAPS capsule Take 50,000 Units by mouth every 7 (seven) days.    [provider]  zolpidem (AMBIEN) 5 MG tablet Take 5 mg by  mouth at bedtime as needed for sleep.    [provider]    Family History No family history on file.  Social History Social History   Tobacco Use  . Smoking status: Never Smoker  . Smokeless tobacco: Never Used  Substance Use Topics  . Alcohol use: No  . Drug use: Not on file     Allergies   Patient has no known allergies.   Review of Systems Review of Systems  Neurological: Positive for headaches.  All other systems reviewed and are negative.    Physical Exam Updated Vital Signs BP (!) 152/63   Pulse (!) 55   Temp 97.7 F (36.5 C) (Oral)   Resp 16   Ht 5' 5.5" (1.664 m)   Wt 63.5 kg (140 lb)   SpO2 100%   BMI 22.94 kg/m   Physical Exam  Constitutional: She is oriented to person,  place, and time. She appears well-developed and well-nourished.  HENT:  Head: Normocephalic and atraumatic.  Mouth/Throat: Oropharynx is clear and moist.  Eyes: Conjunctivae and EOM are normal. Pupils are equal, round, and reactive to light.  Neck: Neck supple.  Cardiovascular: Normal rate and regular rhythm.  No murmur heard. Pulmonary/Chest: Effort normal and breath sounds normal. No respiratory distress.  Abdominal: Soft. There is no tenderness. There is no rebound and no guarding.  Musculoskeletal: She exhibits no edema or tenderness.  Neurological: She is alert and oriented to person, place, and time. No cranial nerve deficit.  5/5 strength all four extremities with sensation to light touch intact in all four extremities.    Skin: Skin is warm and dry.  Psychiatric: She has a normal mood and affect. Her behavior is normal.  Nursing note and vitals reviewed.    ED Treatments / Results  Labs (all labs ordered are listed, but only abnormal results are displayed) Labs Reviewed  BASIC METABOLIC PANEL - Abnormal; Notable for the following components:      Result Value   Potassium 3.3 (*)    Chloride 112 (*)    Glucose, Bld 100 (*)    Calcium 8.7 (*)    Anion gap 4 (*)    All other components within normal limits  CBC WITH DIFFERENTIAL/PLATELET - Abnormal; Notable for the following components:   WBC 3.4 (*)    RBC 3.80 (*)    Hemoglobin 11.7 (*)    HCT 35.4 (*)    Platelets 122 (*)    All other components within normal limits    EKG  EKG Interpretation None       Radiology Ct Head Wo Contrast  Result Date: 12/03/2017 CLINICAL DATA:  Severe headache with nausea. EXAM: CT HEAD WITHOUT CONTRAST TECHNIQUE: Contiguous axial images were obtained from the base of the skull through the vertex without intravenous contrast. COMPARISON:  None. FINDINGS: Brain: No evidence of acute infarction, hemorrhage, hydrocephalus, extra-axial collection or mass lesion/mass effect. Vascular:  No hyperdense vessel or unexpected calcification. Skull: Normal. Negative for fracture or focal lesion. Sinuses/Orbits: No acute finding. Other: None. IMPRESSION: No cause for the patient's headache identified. Electronically Signed   By: Gerome Sam III M.D   On: 12/03/2017 18:30    Procedures Procedures (including critical care time)  Medications Ordered in ED Medications  sodium chloride 0.9 % bolus 500 mL (0 mLs Intravenous Stopped 12/03/17 1836)  metoCLOPramide (REGLAN) injection 10 mg (10 mg Intravenous Given 12/03/17 1756)  magnesium sulfate IVPB 1 g 100 mL (0 g Intravenous Stopped 12/03/17 1828)  ketorolac (TORADOL) 15 MG/ML injection 15 mg (15 mg Intravenous Given 12/03/17 2006)  prochlorperazine (COMPAZINE) injection 10 mg (10 mg Intravenous Given 12/03/17 2006)  valproate (DEPACON) 500 mg in dextrose 5 % 50 mL IVPB (0 mg Intravenous Stopped 12/03/17 2257)  dexamethasone (DECADRON) tablet 10 mg (10 mg Oral Given 12/03/17 2137)  diphenhydrAMINE (BENADRYL) capsule 25 mg (25 mg Oral Given 12/03/17 2137)  valproate (DEPACON) 500 MG/5ML injection (  Given 12/03/17 2138)     Initial Impression / Assessment and Plan / ED Course  I have reviewed the triage vital signs and the nursing notes.  Pertinent labs & imaging results that were available during my care of the patient were reviewed by me and considered in my medical decision making (see chart for details).     Patient here for evaluation of headache, has a history of migraines and this is typical for her prior migraines but is been several years since her last headache.  She appears uncomfortable on examination but nontoxic.  There are no focal neurologic deficits.  Presentation is not consistent with subarachnoid hemorrhage, meningitis, dural sinus thrombosis, CVA.  Following treatment in the emergency department she states that her headache is improved.  Counseled patient on the importance of PCP follow-up as well as return precautions.  CBC  with pancytopenia, similar when compared to priors.  Final Clinical Impressions(s) / ED Diagnoses   Final diagnoses:  Bad headache    ED Discharge Orders    None       Tilden Fossaees, Jailah Willis, MD 12/03/17 2358

## 2022-12-29 DEATH — deceased
